# Patient Record
Sex: Male | Born: 1975 | Race: Black or African American | Hispanic: No | Marital: Married | Smoking: Never smoker
Health system: Southern US, Community
[De-identification: ages and names within clinical notes are randomized; demographics above are authoritative.]

## PROBLEM LIST (undated history)

## (undated) DIAGNOSIS — G43909 Migraine, unspecified, not intractable, without status migrainosus: Secondary | ICD-10-CM

## (undated) DIAGNOSIS — I10 Essential (primary) hypertension: Secondary | ICD-10-CM

---

## 2011-12-21 ENCOUNTER — Emergency Department (HOSPITAL_COMMUNITY): Payer: BC Managed Care – PPO

## 2011-12-21 ENCOUNTER — Emergency Department (HOSPITAL_COMMUNITY)
Admission: EM | Admit: 2011-12-21 | Discharge: 2011-12-21 | Disposition: A | Discharge status: Home / Self Care | Payer: BC Managed Care – PPO | Attending: Emergency Medicine | Admitting: Emergency Medicine

## 2011-12-21 ENCOUNTER — Encounter (HOSPITAL_COMMUNITY): Payer: Self-pay | Admitting: Emergency Medicine

## 2011-12-21 DIAGNOSIS — R0602 Shortness of breath: Secondary | ICD-10-CM | POA: Insufficient documentation

## 2011-12-21 DIAGNOSIS — G43909 Migraine, unspecified, not intractable, without status migrainosus: Secondary | ICD-10-CM | POA: Insufficient documentation

## 2011-12-21 DIAGNOSIS — R079 Chest pain, unspecified: Secondary | ICD-10-CM

## 2011-12-21 HISTORY — DX: Migraine, unspecified, not intractable, without status migrainosus: G43.909

## 2011-12-21 HISTORY — DX: Essential (primary) hypertension: I10

## 2011-12-21 LAB — COMPREHENSIVE METABOLIC PANEL
Albumin: 4.4 g/dL (ref 3.5–5.2)
Alkaline Phosphatase: 52 U/L (ref 39–117)
BUN: 13 mg/dL (ref 6–23)
CO2: 27 mEq/L (ref 19–32)
Chloride: 102 mEq/L (ref 96–112)
GFR calc Af Amer: >90 mL/min (ref >90)
GFR calc non Af Amer: 85 mL/min — ABNORMAL LOW (ref >90)
Glucose, Bld: 121 mg/dL — ABNORMAL HIGH (ref 70–99)
Potassium: 3.3 mEq/L — ABNORMAL LOW (ref 3.5–5.1)
Total Bilirubin: 2.7 mg/dL — ABNORMAL HIGH (ref 0.3–1.2)

## 2011-12-21 LAB — CARDIAC PANEL(CRET KIN+CKTOT+MB+TROPI): Total CK: 524 U/L — ABNORMAL HIGH (ref 7–232)

## 2011-12-21 LAB — CBC WITH DIFFERENTIAL/PLATELET
HCT: 39.3 % (ref 39.0–52.0)
Hemoglobin: 13.8 g/dL (ref 13.0–17.0)
Lymphs Abs: 1.6 10*3/uL (ref 0.7–4.0)
Monocytes Relative: 6 % (ref 3–12)
Neutro Abs: 4.6 10*3/uL (ref 1.7–7.7)
Neutrophils Relative %: 70 % (ref 43–77)
RBC: 5.12 MIL/uL (ref 4.22–5.81)

## 2011-12-21 LAB — POCT I-STAT TROPONIN I: Troponin i, poc: 0 ng/mL (ref 0.00–0.08)

## 2011-12-21 MED ORDER — PROMETHAZINE HCL 25 MG/ML IJ SOLN
12.5000 mg | Freq: Once | INTRAMUSCULAR | Status: AC
  Administered 2011-12-21: 12.5 mg via INTRAVENOUS
  Filled 2011-12-21: qty 1

## 2011-12-21 NOTE — ED Provider Notes (Signed)
History     CSN: [id number]  Arrival date & time 12/21/11  1604   First MD Initiated Contact with Patient 12/21/11 1720      Chief Complaint  Patient presents with  . Headache  . Chest Pain  . Shortness of Breath    (Consider location/radiation/quality/duration/timing/severity/associated sxs/prior treatment) HPI Comments: Patient comes in today with 2 complaints one is migraines in when his chest pain. He states he's been having both migraines and chest pains off and on for last several years but has never had them checked out before. He states both are getting more frequent in nature with his headaches occurring once or twice a week and his chest pain occurring several times a week. He describes his headache as a constant throbbing to the left side of his head and behind his left eye. This occurs approximately 2 times a week and he takes Excedrin which typically relieves the pain. Denies any numbness or weakness in his extremities. Denies any slurred speech. Denies any balance problems. Denies any recent head injuries. He also describes an intermittent sharp pain to the left side of his chest which occurs sporadically several times a week. It's not related to exertion. He does feel like he has more incidents with stress. Denies any shortness of breath. He does say it sharp and is worse with breathing it typically lasts about 15 minutes and then goes away. His last episode was early this morning he denies any current chest pain or shortness of breath. Denies any nausea vomiting or diaphoresis associated with it. Denies any leg pain or swelling  Patient is a 36 y.o. male presenting with headaches, chest pain, and shortness of breath. The history is provided by the patient.  Headache  Associated symptoms include shortness of breath. Pertinent negatives include no fever, no nausea and no vomiting.  Chest Pain Primary symptoms include shortness of breath. Pertinent negatives for primary symptoms  include no fever, no fatigue, no cough, no abdominal pain, no nausea, no vomiting and no dizziness.  Pertinent negatives for associated symptoms include no diaphoresis, no numbness and no weakness.    Shortness of Breath  Associated symptoms include chest pain and shortness of breath. Pertinent negatives include no fever, no rhinorrhea and no cough.    Past Medical History  Diagnosis Date  . Migraines   . Hypertension     History reviewed. No pertinent past surgical history.  No family history on file.  History  Substance Use Topics  . Smoking status: Never Smoker   . Smokeless tobacco: Not on file  . Alcohol Use: No      Review of Systems  Constitutional: Negative for fever, chills, diaphoresis and fatigue.  HENT: Negative for congestion, rhinorrhea and sneezing.   Eyes: Negative.   Respiratory: Positive for shortness of breath. Negative for cough and chest tightness.   Cardiovascular: Positive for chest pain. Negative for leg swelling.  Gastrointestinal: Negative for nausea, vomiting, abdominal pain, diarrhea and blood in stool.  Genitourinary: Negative for frequency, hematuria, flank pain and difficulty urinating.  Musculoskeletal: Negative for back pain and arthralgias.  Skin: Negative for rash.  Neurological: Positive for headaches. Negative for dizziness, speech difficulty, weakness and numbness.    Allergies  Review of patient's allergies indicates no known allergies.  Home Medications   Current Outpatient Rx  Name Route Sig Dispense Refill  . ASPIRIN-ACETAMINOPHEN-CAFFEINE 250-250-65 MG PO TABS Oral Take 4 tablets by mouth every 6 (six) hours as needed. Migraine    .  ADULT MULTIVITAMIN W/MINERALS CH Oral Take 2 tablets by mouth daily.      BP 148/95  Pulse 95  Temp 98.8 F (37.1 C) (Oral)  Resp 16  SpO2 98%  Physical Exam  Constitutional: He is oriented to person, place, and time. He appears well-developed and well-nourished.  HENT:  Head:  Normocephalic and atraumatic.  Eyes: Pupils are equal, round, and reactive to light.  Neck: Normal range of motion. Neck supple.  Cardiovascular: Normal rate, regular rhythm and normal heart sounds.   Pulmonary/Chest: Effort normal and breath sounds normal. No respiratory distress. He has no wheezes. He has no rales. He exhibits no tenderness.  Abdominal: Soft. Bowel sounds are normal. There is no tenderness. There is no rebound and no guarding.  Musculoskeletal: Normal range of motion. He exhibits no edema and no tenderness.  Lymphadenopathy:    He has no cervical adenopathy.  Neurological: He is alert and oriented to person, place, and time. He has normal strength. No cranial nerve deficit or sensory deficit. GCS eye subscore is 4. GCS verbal subscore is 5. GCS motor subscore is 6.  Skin: Skin is warm and dry. No rash noted.  Psychiatric: He has a normal mood and affect.    ED Course  Procedures (including critical care time)  Results for orders placed during the hospital encounter of 12/21/11  CBC WITH DIFFERENTIAL      Component Value Range   WBC 6.6  4.0 - 10.5 K/uL   RBC 5.12  4.22 - 5.81 MIL/uL   Hemoglobin 13.8  13.0 - 17.0 g/dL   HCT 16.1  09.6 - 04.5 %   MCV 76.8 (*) 78.0 - 100.0 fL   MCH 27.0  26.0 - 34.0 pg   MCHC 35.1  30.0 - 36.0 g/dL   RDW 40.9  81.1 - 91.4 %   Platelets 177  150 - 400 K/uL   Neutrophils Relative 70  43 - 77 %   Neutro Abs 4.6  1.7 - 7.7 K/uL   Lymphocytes Relative 23  12 - 46 %   Lymphs Abs 1.6  0.7 - 4.0 K/uL   Monocytes Relative 6  3 - 12 %   Monocytes Absolute 0.4  0.1 - 1.0 K/uL   Eosinophils Relative 1  0 - 5 %   Eosinophils Absolute 0.0  0.0 - 0.7 K/uL   Basophils Relative 0  0 - 1 %   Basophils Absolute 0.0  0.0 - 0.1 K/uL  COMPREHENSIVE METABOLIC PANEL      Component Value Range   Sodium 140  135 - 145 mEq/L   Potassium 3.3 (*) 3.5 - 5.1 mEq/L   Chloride 102  96 - 112 mEq/L   CO2 27  19 - 32 mEq/L   Glucose, Bld 121 (*) 70 - 99  mg/dL   BUN 13  6 - 23 mg/dL   Creatinine, Ser 7.82  0.50 - 1.35 mg/dL   Calcium 9.4  8.4 - 95.6 mg/dL   Total Protein 7.4  6.0 - 8.3 g/dL   Albumin 4.4  3.5 - 5.2 g/dL   AST 42 (*) 0 - 37 U/L   ALT 57 (*) 0 - 53 U/L   Alkaline Phosphatase 52  39 - 117 U/L   Total Bilirubin 2.7 (*) 0.3 - 1.2 mg/dL   GFR calc non Af Amer 85 (*) >90 mL/min   GFR calc Af Amer >90  >90 mL/min  CARDIAC PANEL(CRET KIN+CKTOT+MB+TROPI)      Component  Value Range   Total CK 524 (*) 7 - 232 U/L   CK, MB 4.5 (*) 0.3 - 4.0 ng/mL   Troponin I <0.30  <0.30 ng/mL   Relative Index 0.9  0.0 - 2.5  POCT I-STAT TROPONIN I      Component Value Range   Troponin i, poc 0.00  0.00 - 0.08 ng/mL   Comment 3            Dg Chest 2 View  12/21/2011  *RADIOLOGY REPORT*  Clinical Data: Chest pain, short of breath  CHEST - 2 VIEW  Comparison: None.  Findings: Normal mediastinum and cardiac silhouette.  Normal pulmonary  vasculature.  No evidence of effusion, infiltrate, or pneumothorax.  No acute bony abnormality.  IMPRESSION: No acute cardiopulmonary process.  Original Report Authenticated By: Genevive Bi, M.D.   Ct Head Wo Contrast  12/21/2011  *RADIOLOGY REPORT*  Clinical Data: Headache  CT HEAD WITHOUT CONTRAST  Technique:  Contiguous axial images were obtained from the base of the skull through the vertex without contrast.  Comparison: None.  Findings: No acute intracranial hemorrhage.  No focal mass lesion. No CT evidence of acute infarction.   No midline shift or mass effect.  No hydrocephalus.  Basilar cisterns are patent. Paranasal sinuses and mastoid air cells are clear.  Orbits are normal.  IMPRESSION: Normal c t head.  Original Report Authenticated By: Genevive Bi, M.D.       Date: 12/21/2011  Rate: 92   Rhythm: normal sinus rhythm  QRS Axis: normal  Intervals: normal  ST/T Wave abnormalities: nonspecific ST/T changes  Conduction Disutrbances:none  Narrative Interpretation:   Old EKG Reviewed: none  available    1. Migraine   2. Chest pain       MDM  Patient is chest pain off and on for the last several months. There's no worsening symptoms in the recent past. Do not feel the patient needs to be admitted currently. I did stress that he needs followup with her primary care physician. He also has ongoing migraines which are increasing in frequency. CT did not show any evidence of mass. There's nothing else to suggest subarachnoid hemorrhage meningitis or other intracranial process. I advised him to use caution when taking Excedrin.        Rolan Bucco, MD 12/21/11 [phone]

## 2011-12-21 NOTE — ED Notes (Signed)
Pt admits to being under a lot of stress.  Has been having headaches on/off for years but never had them checked out.  Chest pains on/off x couple months w/accompanying shortness of breath.  Currently no chest pain or shob.

## 2011-12-21 NOTE — ED Notes (Signed)
Resting with family at bedside. States h/a some better, normal treatment at home: coffee and excedrin, took today upon waking up from nap.

## 2011-12-21 NOTE — ED Notes (Signed)
Pt verbalizes understanding.  Pt wife, ona rathert at bedside to drive patient home.

## 2011-12-21 NOTE — ED Notes (Signed)
XLK:[id number]> Expected date:12/21/11<BR> Expected time: 7:47 PM<BR> Means of arrival:Ambulance<BR> Comments:<BR> RM 19, [facility]: Decreased PO intake, 35 yo F, abdominal pain

## 2011-12-21 NOTE — ED Notes (Signed)
Pt presenting to ed with c/o chest pain that started 7-10 years ago pt states he has never had it checked out. Pt state he has had shortness of breath with positive nausea. Pt denies vomiting pt states he also has a migraine headache.

## 2013-04-13 IMAGING — CT CT HEAD W/O CM
2 series · 16 of 30 positions shown, 20 images · non-contrast
Comparison: None.

CLINICAL DATA: Headache

CT HEAD WITHOUT CONTRAST
TECHNIQUE: Contiguous axial images were obtained from the base of
the skull through the vertex without contrast.

[Series 2: head w/o · axial · non-contrast · 0.49mm/px · z∈[+1643,+1773]mm · 13 of 32 slices shown, 17 images]
[im 3/32  brain]
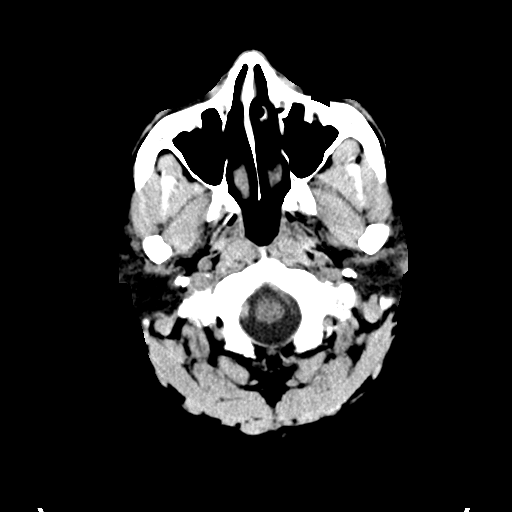
[im 3/32  bone]
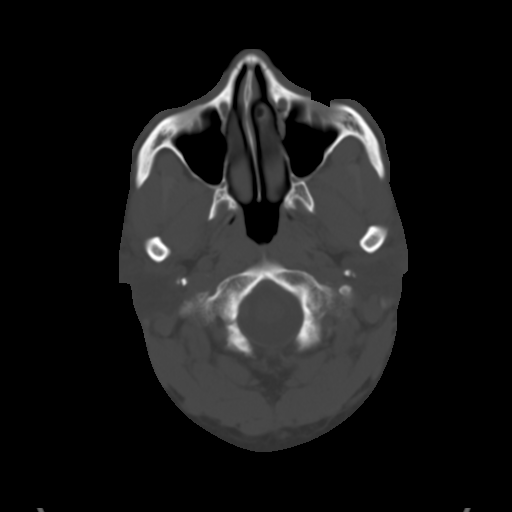
[im 5/32  brain]
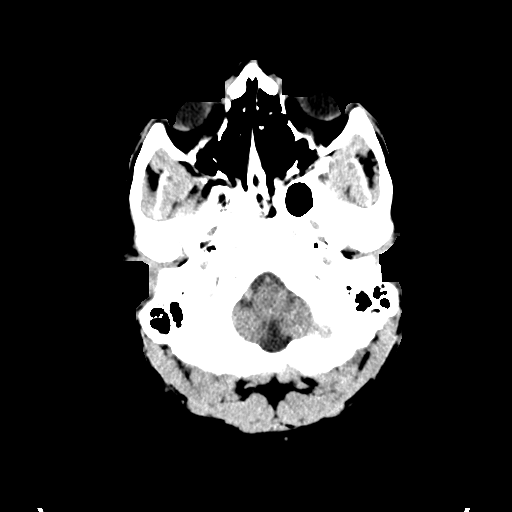
[im 7/32  brain]
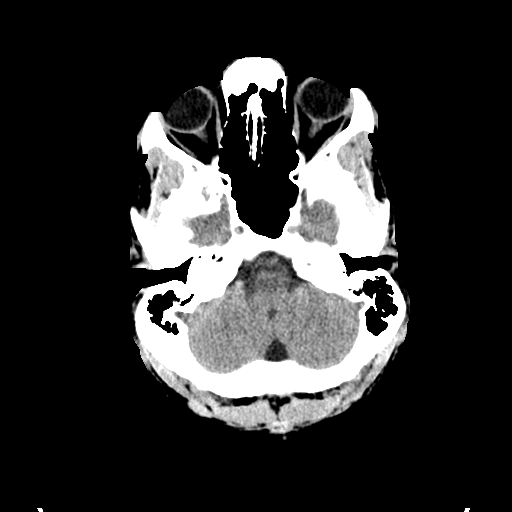
[im 9/32  brain]
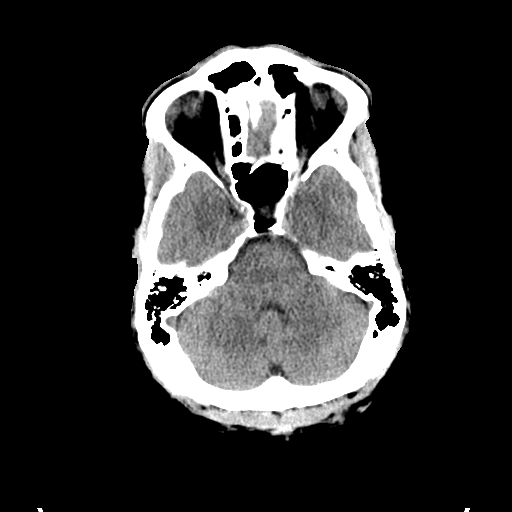
[im 12/32  brain]
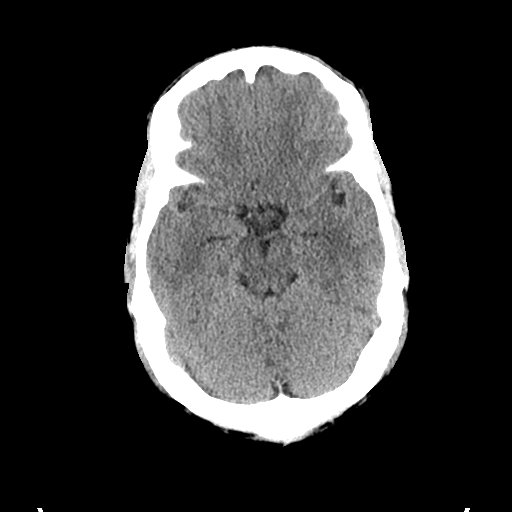
[im 12/32  bone]
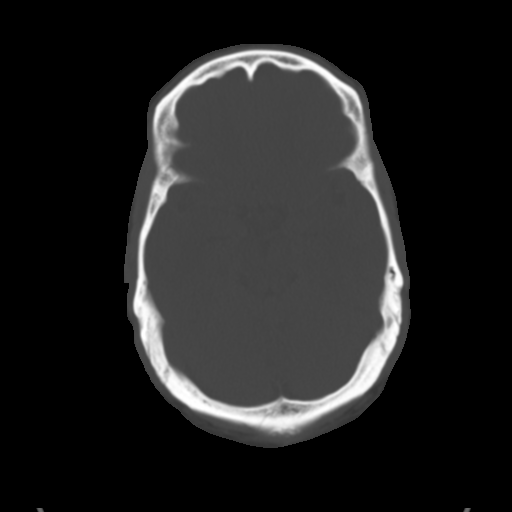
[im 14/32  brain]
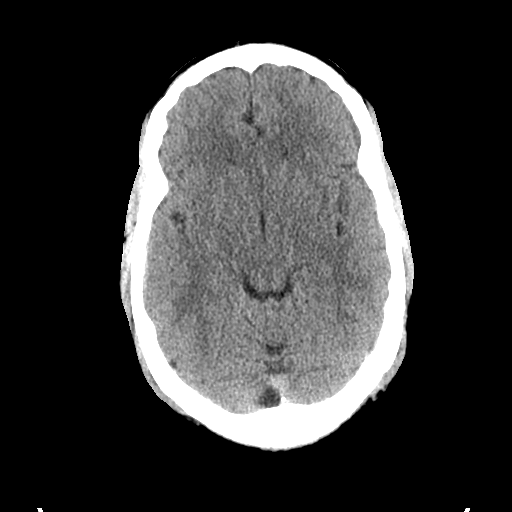
[im 16/32  brain]
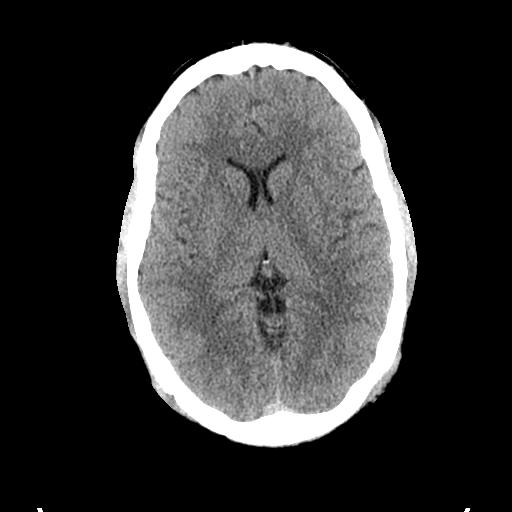
[im 18/32  brain]
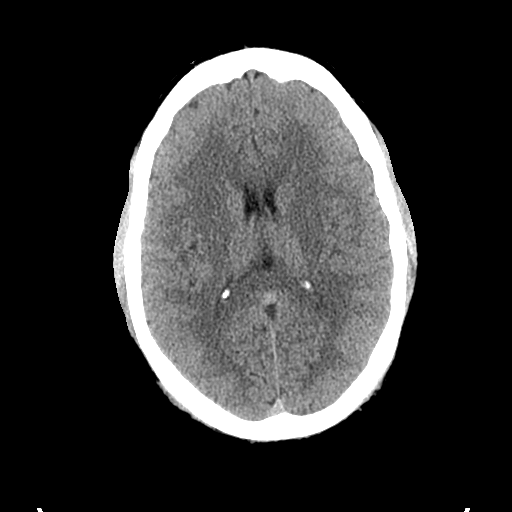
[im 20/32  brain]
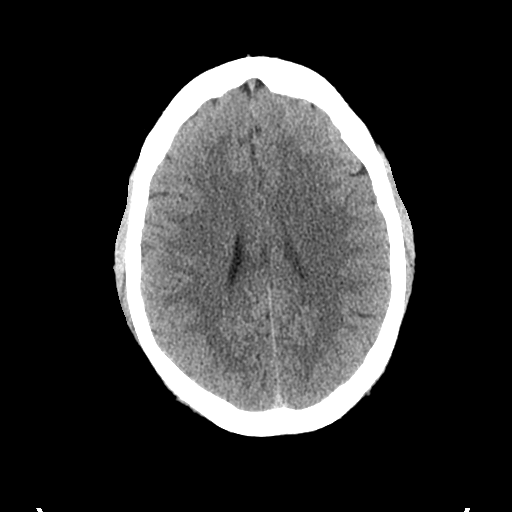
[im 20/32  bone]
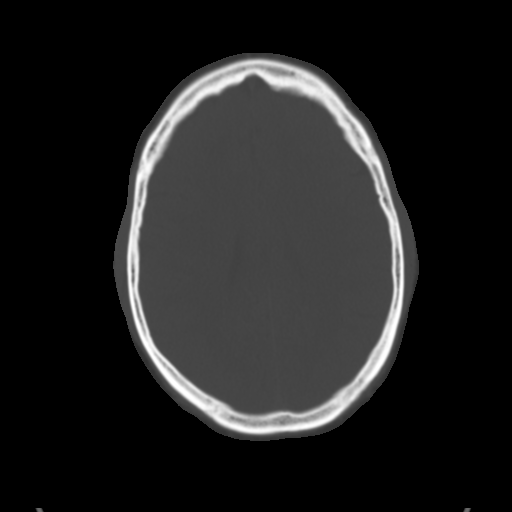
[im 23/32  brain]
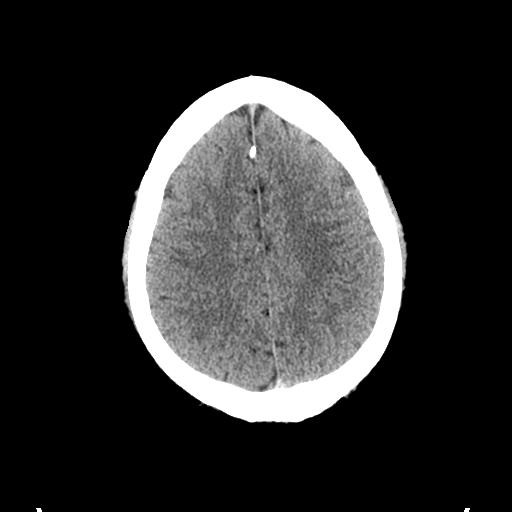
[im 25/32  brain]
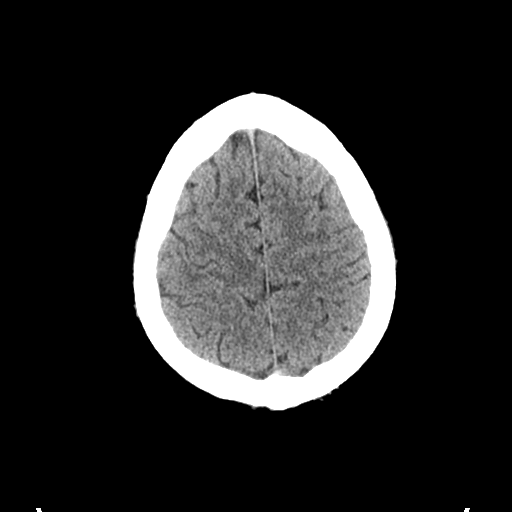
[im 27/32  brain]
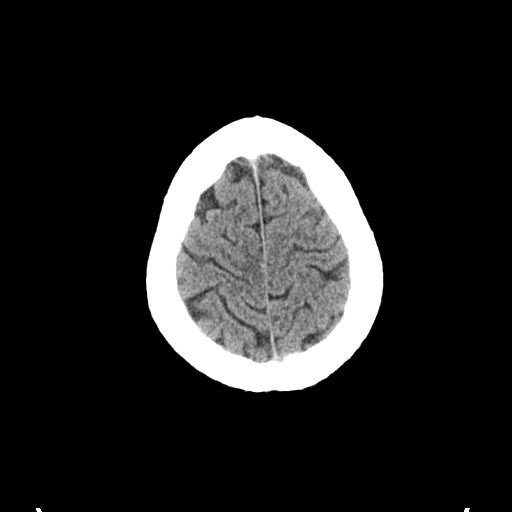
[im 29/32  brain]
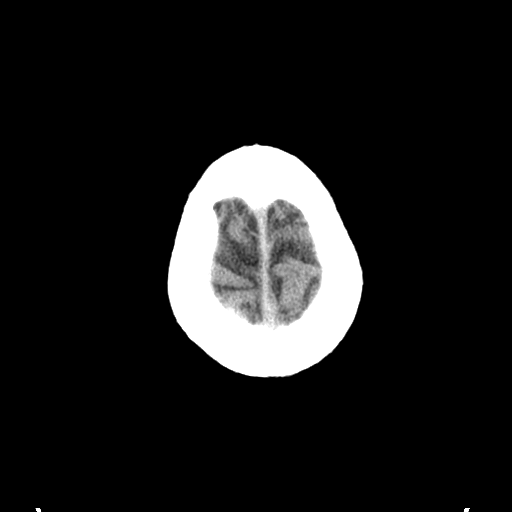
[im 29/32  bone]
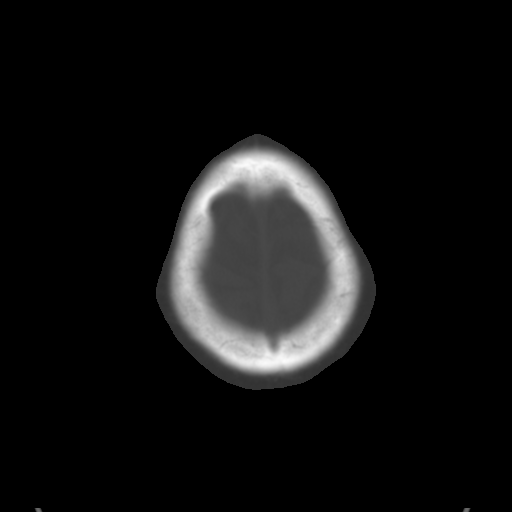

[Series 3: bone windows · axial · 0.49mm/px · z∈[+1643,+1688]mm · 3 of 32 slices shown]
[im 3/32  bone]
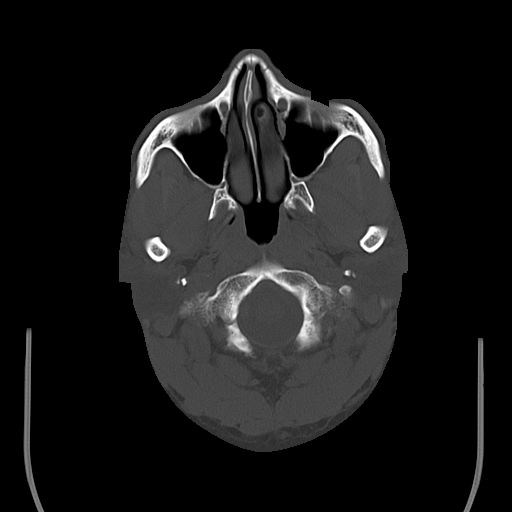
[im 7/32  bone]
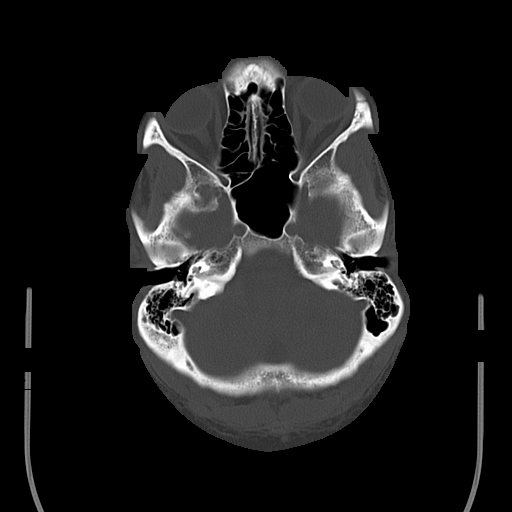
[im 12/32  bone]
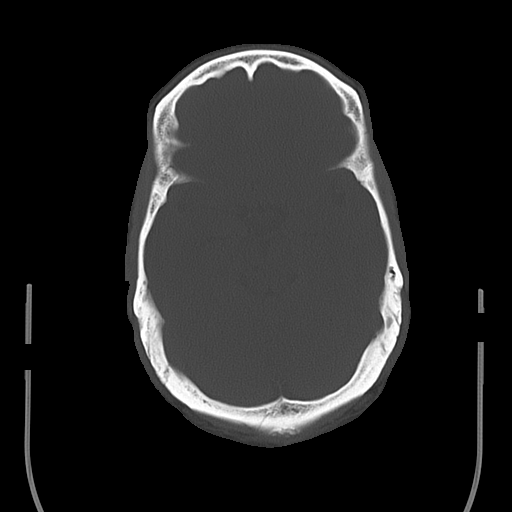

[16 of 30 positions shown; findings below may reference images not displayed]

FINDINGS: No acute intracranial hemorrhage.  No focal mass lesion.
No CT evidence of acute infarction.   No midline shift or mass
effect.  No hydrocephalus.  Basilar cisterns are patent. Paranasal
sinuses and mastoid air cells are clear.  Orbits are normal.
IMPRESSION: Normal c t head.

## 2013-07-21 ENCOUNTER — Ambulatory Visit: Payer: BC Managed Care – PPO | Admitting: *Deleted

## 2013-09-01 ENCOUNTER — Ambulatory Visit: Payer: BC Managed Care – PPO | Admitting: *Deleted

## 2019-07-30 ENCOUNTER — Ambulatory Visit: Payer: BC Managed Care – PPO | Attending: Family

## 2019-07-30 DIAGNOSIS — Z23 Encounter for immunization: Secondary | ICD-10-CM

## 2019-07-30 NOTE — Progress Notes (Signed)
   Covid-19 Vaccination Clinic  Name:  Zachary Anthony    MRN: [id number] DOB: Sep 16, 1975  07/30/2019  Mr. Zachary Anthony was observed post Covid-19 immunization for 15 minutes without incident. He was provided with Vaccine Information Sheet and instruction to access the V-Safe system.   Mr. Zachary Anthony was instructed to call 911 with any severe reactions post vaccine: Marland Kitchen Difficulty breathing  . Swelling of face and throat  . A fast heartbeat  . A bad rash all over body  . Dizziness and weakness   Immunizations Administered    Name Date Dose VIS Date Route   Moderna COVID-19 Vaccine 07/30/2019  2:14 PM 0.5 mL 04/14/2019 Intramuscular   Manufacturer: Moderna   Lot: [id number]   NDC: 62229-798-92

## 2019-08-27 ENCOUNTER — Ambulatory Visit: Payer: BC Managed Care – PPO | Attending: Family

## 2019-08-27 DIAGNOSIS — Z23 Encounter for immunization: Secondary | ICD-10-CM

## 2019-08-27 NOTE — Progress Notes (Signed)
   Covid-19 Vaccination Clinic  Name:  MAHAMED ZALEWSKI    MRN: [id number] DOB: 02-24-1976  08/27/2019  Mr. Nauta was observed post Covid-19 immunization for 15 minutes without incident. He was provided with Vaccine Information Sheet and instruction to access the V-Safe system.   Mr. Burdin was instructed to call 911 with any severe reactions post vaccine: Marland Kitchen Difficulty breathing  . Swelling of face and throat  . A fast heartbeat  . A bad rash all over body  . Dizziness and weakness   Immunizations Administered    Name Date Dose VIS Date Route   Moderna COVID-19 Vaccine 08/27/2019  4:30 PM 0.5 mL 04/14/2019 Intramuscular   Manufacturer: Moderna   Lot: [id number]   NDC: 91638-466-59

## 2019-09-01 ENCOUNTER — Ambulatory Visit: Payer: BC Managed Care – PPO

## 2022-11-20 ENCOUNTER — Encounter: Payer: Self-pay | Admitting: Dermatology

## 2022-11-20 ENCOUNTER — Ambulatory Visit (INDEPENDENT_AMBULATORY_CARE_PROVIDER_SITE_OTHER): Payer: BC Managed Care – PPO | Admitting: Dermatology

## 2022-11-20 VITALS — BP 132/87 | HR 77

## 2022-11-20 DIAGNOSIS — D485 Neoplasm of uncertain behavior of skin: Secondary | ICD-10-CM | POA: Diagnosis not present

## 2022-11-20 DIAGNOSIS — D492 Neoplasm of unspecified behavior of bone, soft tissue, and skin: Secondary | ICD-10-CM

## 2022-11-20 NOTE — Patient Instructions (Addendum)
Thank you for visiting our clinic today. We appreciate your commitment to addressing your health concerns and are here to support you through the process of diagnosing and treating the nodule on your leg.  Here is a summary of the key points from today's consultation:  - Diagnosis and Procedure Plan:   - You have a 4 cm firm subcutaneous nodule located on the left superior lateral knee, which appears to be a calcified cyst.   - A surgical excision is recommended to remove the nodule and send it for lab analysis to determine its nature. This is important as it could potentially be a slow-growing malignancy, though it is likely benign.  - Surgical Details:   - The procedure will be scheduled for after your trip to [place] in August, with our new surgeon, Dr. Phillis Knack, who starts in September. This timing will avoid any complications from your travel and water activities.    - The surgery will be performed under local anesthesia in a setting similar to our consultation room. You will be awake, and the area will be numbed to ensure you feel no pain during the procedure.   Please feel free to reach out if you have any questions or need further clarification on the planned procedure. We are here to assist you every step of the way.   Due to recent changes in healthcare laws, you may see results of your pathology and/or laboratory studies on MyChart before the doctors have had a chance to review them. We understand that in some cases there may be results that are confusing or concerning to you. Please understand that not all results are received at the same time and often the doctors may need to interpret multiple results in order to provide you with the best plan of care or course of treatment. Therefore, we ask that you please give [place] 2 business days to thoroughly review all your results before contacting the office for clarification. Should we see a critical lab result, you will be contacted sooner.   If  You Need Anything After Your Visit  If you have any questions or concerns for your doctor, please call our main line at [phone] If no one answers, please leave a voicemail as directed and we will return your call as soon as possible. Messages left after 4 pm will be answered the following business day.   You may also send [place] a message via MyChart. We typically respond to MyChart messages within 1-2 business days.  For prescription refills, please ask your pharmacy to contact our office. Our fax number is [phone].  If you have an urgent issue when the clinic is closed that cannot wait until the next business day, you can page your doctor at the number below.    Please note that while we do our best to be available for urgent issues outside of office hours, we are not available 24/7.   If you have an urgent issue and are unable to reach [place], you may choose to seek medical care at your doctor's office, retail clinic, urgent care center, or emergency room.  If you have a medical emergency, please immediately call 911 or go to the emergency department. In the event of inclement weather, please call our main line at [phone] for an update on the status of any delays or closures.  Dermatology Medication Tips: Please keep the boxes that topical medications come in in order to help keep track of the instructions about where and  how to use these. Pharmacies typically print the medication instructions only on the boxes and not directly on the medication tubes.   If your medication is too expensive, please contact our office at [phone] or send [place] a message through MyChart.   We are unable to tell what your co-pay for medications will be in advance as this is different depending on your insurance coverage. However, we may be able to find a substitute medication at lower cost or fill out paperwork to get insurance to cover a needed medication.   If a prior authorization is required to get  your medication covered by your insurance company, please allow [place] 1-2 business days to complete this process.  Drug prices often vary depending on where the prescription is filled and some pharmacies may offer cheaper prices.  The website www.goodrx.com contains coupons for medications through different pharmacies. The prices here do not account for what the cost may be with help from insurance (it may be cheaper with your insurance), but the website can give you the price if you did not use any insurance.  - You can print the associated coupon and take it with your prescription to the pharmacy.  - You may also stop by our office during regular business hours and pick up a GoodRx coupon card.  - If you need your prescription sent electronically to a different pharmacy, notify our office through [facility] or by phone at [phone]

## 2022-11-20 NOTE — Progress Notes (Unsigned)
   New Patient Visit   Subjective  Zachary Anthony is a 47 y.o. male who presents for the following: Spot at left knee. Dur: >10 years. Gradually increased in size over the years. Has been this size for ~10 years. Non tender unless bumped.   The patient has spots, moles and lesions to be evaluated, some may be new or changing and the patient has concerns that these could be cancer.    The following portions of the chart were reviewed this encounter and updated as appropriate: medications, allergies, medical history  Review of Systems:  No other skin or systemic complaints except as noted in HPI or Assessment and Plan.  Objective  Well appearing patient in no apparent distress; mood and affect are within normal limits.  A focused examination was performed of the following areas: Left leg/knee Relevant physical exam findings are noted in the Assessment and Plan.  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 Phillis Knack in September.      Return if symptoms worsen or fail to improve.  I, Lawson Radar, CMA, am acting as scribe for [employer], DO.   Documentation: I have reviewed the above documentation for accuracy and completeness, and I agree with the above.  Langston Reusing, DO

## 2022-11-29 ENCOUNTER — Other Ambulatory Visit: Payer: Self-pay

## 2022-11-29 DIAGNOSIS — L720 Epidermal cyst: Secondary | ICD-10-CM

## 2023-03-06 ENCOUNTER — Ambulatory Visit: Payer: BC Managed Care – PPO | Admitting: Dermatology

## 2023-03-06 ENCOUNTER — Encounter: Payer: BC Managed Care – PPO | Admitting: Dermatology

## 2023-03-27 ENCOUNTER — Encounter: Payer: BC Managed Care – PPO | Admitting: Dermatology
